# Patient Record
Sex: Male | Born: 1990 | Race: Black or African American | Hispanic: No | Marital: Married
Health system: Southern US, Community
[De-identification: ages and names within clinical notes are randomized; demographics above are authoritative.]

---

## 2021-09-16 ENCOUNTER — Emergency Department (HOSPITAL_COMMUNITY)
Admission: EM | Admit: 2021-09-16 | Discharge: 2021-09-17 | Disposition: A | Discharge status: Home / Self Care | Payer: No Typology Code available for payment source | Attending: Emergency Medicine | Admitting: Emergency Medicine

## 2021-09-16 ENCOUNTER — Other Ambulatory Visit: Payer: Self-pay

## 2021-09-16 ENCOUNTER — Encounter (HOSPITAL_COMMUNITY): Payer: Self-pay | Admitting: Emergency Medicine

## 2021-09-16 DIAGNOSIS — E041 Nontoxic single thyroid nodule: Secondary | ICD-10-CM | POA: Diagnosis not present

## 2021-09-16 DIAGNOSIS — Z043 Encounter for examination and observation following other accident: Secondary | ICD-10-CM | POA: Diagnosis present

## 2021-09-16 DIAGNOSIS — S3991XA Unspecified injury of abdomen, initial encounter: Secondary | ICD-10-CM | POA: Diagnosis not present

## 2021-09-16 DIAGNOSIS — Y92410 Unspecified street and highway as the place of occurrence of the external cause: Secondary | ICD-10-CM | POA: Diagnosis not present

## 2021-09-16 DIAGNOSIS — R911 Solitary pulmonary nodule: Secondary | ICD-10-CM | POA: Diagnosis not present
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
## 2021-09-17 ENCOUNTER — Emergency Department (HOSPITAL_COMMUNITY): Payer: No Typology Code available for payment source

## 2021-09-17 ENCOUNTER — Encounter (HOSPITAL_COMMUNITY): Payer: Self-pay | Admitting: Emergency Medicine

## 2021-09-17 LAB — CBC WITH DIFFERENTIAL/PLATELET
Abs Immature Granulocytes: 0.01 10*3/uL (ref 0.00–0.07)
Basophils Absolute: 0.1 10*3/uL (ref 0.0–0.1)
Basophils Relative: 1 %
Eosinophils Absolute: 0.2 10*3/uL (ref 0.0–0.5)
Eosinophils Relative: 3 %
HCT: 41.8 % (ref 39.0–52.0)
Hemoglobin: 13.2 g/dL (ref 13.0–17.0)
Immature Granulocytes: 0 %
Lymphocytes Relative: 36 %
Lymphs Abs: 2.3 10*3/uL (ref 0.7–4.0)
MCH: 29.3 pg (ref 26.0–34.0)
MCHC: 31.6 g/dL (ref 30.0–36.0)
MCV: 92.9 fL (ref 80.0–100.0)
Monocytes Absolute: 0.8 10*3/uL (ref 0.1–1.0)
Monocytes Relative: 12 %
Neutro Abs: 3.1 10*3/uL (ref 1.7–7.7)
Neutrophils Relative %: 48 %
Platelets: 233 10*3/uL (ref 150–400)
RBC: 4.5 MIL/uL (ref 4.22–5.81)
RDW: 12.5 % (ref 11.5–15.5)
WBC: 6.5 10*3/uL (ref 4.0–10.5)
nRBC: 0 % (ref 0.0–0.2)

## 2021-09-17 LAB — I-STAT CHEM 8, ED
BUN: 14 mg/dL (ref 6–20)
Calcium, Ion: 1.24 mmol/L (ref 1.15–1.40)
Chloride: 103 mmol/L (ref 98–111)
Creatinine, Ser: 1.1 mg/dL (ref 0.61–1.24)
Glucose, Bld: 100 mg/dL — ABNORMAL HIGH (ref 70–99)
HCT: 42 % (ref 39.0–52.0)
Hemoglobin: 14.3 g/dL (ref 13.0–17.0)
Potassium: 3.8 mmol/L (ref 3.5–5.1)
Sodium: 141 mmol/L (ref 135–145)
TCO2: 27 mmol/L (ref 22–32)

## 2021-09-17 MED ORDER — IOHEXOL 350 MG/ML SOLN
80.0000 mL | Freq: Once | INTRAVENOUS | Status: AC | PRN
  Administered 2021-09-17: 80 mL via INTRAVENOUS

## 2021-09-17 MED ORDER — LIDOCAINE 5 % EX PTCH: 1.0000 | MEDICATED_PATCH | CUTANEOUS | 0 refills | Status: AC

## 2021-09-17 MED ORDER — KETOROLAC TROMETHAMINE 30 MG/ML IJ SOLN
30.0000 mg | Freq: Once | INTRAMUSCULAR | Status: AC
  Administered 2021-09-17: 30 mg via INTRAVENOUS
  Filled 2021-09-17: qty 1

## 2021-09-17 MED ORDER — DICLOFENAC SODIUM ER 100 MG PO TB24: 100.0000 mg | ORAL_TABLET | Freq: Every day | ORAL | 0 refills | Status: AC

## 2021-09-17 NOTE — ED Notes (Signed)
Pt discharged and ambulated out of the ED without difficulty. 
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
## 2021-10-19 ENCOUNTER — Encounter (HOSPITAL_COMMUNITY): Payer: Self-pay | Admitting: Radiology

## 2021-10-25 ENCOUNTER — Ambulatory Visit: Payer: Self-pay | Admitting: *Deleted
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
